# Patient Record
Sex: Male | Born: 1990 | Race: White | Hispanic: No | Marital: Single | Smoking: Never smoker
Health system: Southern US, Community
[De-identification: ages and names within clinical notes are randomized; demographics above are authoritative.]

## PROBLEM LIST (undated history)

## (undated) DIAGNOSIS — I10 Essential (primary) hypertension: Secondary | ICD-10-CM

---
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
## 2018-12-01 ENCOUNTER — Other Ambulatory Visit: Payer: Self-pay

## 2018-12-01 DIAGNOSIS — Z20822 Contact with and (suspected) exposure to covid-19: Secondary | ICD-10-CM

## 2018-12-02 LAB — NOVEL CORONAVIRUS, NAA: SARS-CoV-2, NAA: DETECTED — AB

## 2019-01-30 ENCOUNTER — Other Ambulatory Visit: Payer: Self-pay

## 2019-01-30 ENCOUNTER — Emergency Department: Payer: Managed Care, Other (non HMO)

## 2019-01-30 ENCOUNTER — Emergency Department
Admission: EM | Admit: 2019-01-30 | Discharge: 2019-01-30 | Disposition: A | Discharge status: Home / Self Care | Payer: Managed Care, Other (non HMO) | Attending: Emergency Medicine | Admitting: Emergency Medicine

## 2019-01-30 ENCOUNTER — Encounter: Payer: Self-pay | Admitting: Emergency Medicine

## 2019-01-30 DIAGNOSIS — I10 Essential (primary) hypertension: Secondary | ICD-10-CM | POA: Diagnosis not present

## 2019-01-30 DIAGNOSIS — R079 Chest pain, unspecified: Secondary | ICD-10-CM

## 2019-01-30 DIAGNOSIS — R55 Syncope and collapse: Secondary | ICD-10-CM | POA: Diagnosis not present

## 2019-01-30 DIAGNOSIS — R0781 Pleurodynia: Secondary | ICD-10-CM | POA: Insufficient documentation

## 2019-01-30 DIAGNOSIS — R091 Pleurisy: Secondary | ICD-10-CM

## 2019-01-30 HISTORY — DX: Essential (primary) hypertension: I10

## 2019-01-30 LAB — BASIC METABOLIC PANEL
Anion gap: 13 (ref 5–15)
BUN: 14 mg/dL (ref 6–20)
CO2: 25 mmol/L (ref 22–32)
Calcium: 9.9 mg/dL (ref 8.9–10.3)
Chloride: 102 mmol/L (ref 98–111)
Creatinine, Ser: 1.3 mg/dL — ABNORMAL HIGH (ref 0.61–1.24)
GFR calc Af Amer: >60 mL/min (ref >60)
GFR calc non Af Amer: >60 mL/min (ref >60)
Glucose, Bld: 96 mg/dL (ref 70–99)
Potassium: 3.2 mmol/L — ABNORMAL LOW (ref 3.5–5.1)
Sodium: 140 mmol/L (ref 135–145)

## 2019-01-30 LAB — CBC
HCT: 45.1 % (ref 39.0–52.0)
Hemoglobin: 15.6 g/dL (ref 13.0–17.0)
MCH: 29.6 pg (ref 26.0–34.0)
MCHC: 34.6 g/dL (ref 30.0–36.0)
MCV: 85.6 fL (ref 80.0–100.0)
Platelets: 360 10*3/uL (ref 150–400)
RBC: 5.27 MIL/uL (ref 4.22–5.81)
RDW: 11.8 % (ref 11.5–15.5)
WBC: 7.7 10*3/uL (ref 4.0–10.5)
nRBC: 0 % (ref 0.0–0.2)

## 2019-01-30 LAB — TROPONIN I (HIGH SENSITIVITY): Troponin I (High Sensitivity): 2 ng/L (ref <18)

## 2019-01-30 MED ORDER — ONDANSETRON 4 MG PO TBDP: 4.0000 mg | ORAL_TABLET | Freq: Three times a day (TID) | ORAL | 0 refills | Status: AC | PRN

## 2019-01-30 MED ORDER — IOHEXOL 350 MG/ML SOLN
75.0000 mL | Freq: Once | INTRAVENOUS | Status: AC | PRN
  Administered 2019-01-30: 22:00:00 75 mL via INTRAVENOUS

## 2019-01-30 MED ORDER — SODIUM CHLORIDE 0.9 % IV BOLUS
1000.0000 mL | Freq: Once | INTRAVENOUS | Status: AC
  Administered 2019-01-30: 1000 mL via INTRAVENOUS

## 2019-01-30 MED ORDER — KETOROLAC TROMETHAMINE 10 MG PO TABS: 10.0000 mg | ORAL_TABLET | Freq: Four times a day (QID) | ORAL | 0 refills | Status: AC | PRN

## 2019-01-30 NOTE — Discharge Instructions (Signed)
Your lab tests and CT scan of the chest were all normal today.  Discontinue hydrochlorothiazide as this medication is making you dehydrated and have a low potassium level.  Continue follow-up with your doctor to monitor blood pressure and your symptoms.

## 2019-01-30 NOTE — ED Notes (Addendum)
Pt had syncopal episode in triage, became diaphoretic and pale. IV access obtain and IVF bolus started. Pt reports recently starting on HCTZ. Pt taken via recliner to room 18. Primary RN Darl Pikes notified. Pt is alert at this time, disoriented after passing out, but is able to state his name, the date, where he is and why he is here.

## 2019-01-30 NOTE — ED Provider Notes (Signed)
[facility] Emergency Department Provider Note  ____________________________________________  Time seen: Approximately 8:59 PM  I have reviewed the triage vital signs and the nursing notes.   HISTORY  Chief Complaint Chest Pain    HPI Calvin Acosta is a 29 y.o. male with a reported past medical history of hypertension, started on hydrochlorothiazide 4 days ago.  He also had Covid 2 months ago which was a mild illness that resolved quickly.  He reports that for the last 3 days he is having pleuritic chest pain, shortness of breath, dizziness with standing.  No fevers chills or malaise.  No body aches.  Symptoms are constant, no alleviating factors.  Nonradiating.  In triage while having his blood drawn, he suddenly became pale diaphoretic and nearly passed out, blood pressure was briefly low at 67/39.  He now feels better after that episode which only lasted a few minutes..      Past Medical History:  Diagnosis Date  . Hypertension      There are no problems to display for this patient.    History reviewed. No pertinent surgical history.   Prior to Admission medications   Medication Sig Start Date End Date Taking? Authorizing Provider  ketorolac (TORADOL) 10 MG tablet Take 1 tablet (10 mg total) by mouth every 6 (six) hours as needed for moderate pain. 01/30/19   Sharman Cheek, MD  ondansetron (ZOFRAN ODT) 4 MG disintegrating tablet Take 1 tablet (4 mg total) by mouth every 8 (eight) hours as needed for nausea or vomiting. 01/30/19   Sharman Cheek, MD  Hydrochlorothiazide  Allergies Patient has no known allergies.   No family history on file.  Social History Social History   Tobacco Use  . Smoking status: Never Smoker  . Smokeless tobacco: Never Used  Substance Use Topics  . Alcohol use: Not on file  . Drug use: Not on file    Review of Systems  Constitutional:   No fever or chills.  ENT:   No sore throat. No  rhinorrhea. Cardiovascular:   Positive chest pain as above and triage episode of syncope. Respiratory: Positive shortness of breath without cough. Gastrointestinal:   Negative for abdominal pain, vomiting and diarrhea.  Musculoskeletal:   Negative for focal pain or swelling All other systems reviewed and are negative except as documented above in ROS and HPI.  ____________________________________________   PHYSICAL EXAM:  VITAL SIGNS: ED Triage Vitals  Enc Vitals Group     BP 01/30/19 1935 120/79     Pulse Rate 01/30/19 1935 (!) 106     Resp 01/30/19 1935 18     Temp --      Temp src --      SpO2 01/30/19 1935 98 %     Weight 01/30/19 1955 205 lb (93 kg)     Height 01/30/19 1955 6\' 2"  (1.88 m)     Head Circumference --      Peak Flow --      Pain Score 01/30/19 1955 4     Pain Loc --      Pain Edu? --      Excl. in GC? --     Vital signs reviewed, nursing assessments reviewed.   Constitutional:   Alert and oriented. Non-toxic appearance. Eyes:   Conjunctivae are normal. EOMI. PERRL. ENT      Head:   Normocephalic and atraumatic.      Nose:   Wearing a mask.      Mouth/Throat:  Wearing a mask.      Neck:   No meningismus. Full ROM. Hematological/Lymphatic/Immunilogical:   No cervical lymphadenopathy. Cardiovascular:   RRR. Symmetric bilateral radial and DP pulses.  No murmurs. Cap refill less than 2 seconds. Respiratory:   Normal respiratory effort without tachypnea/retractions. Breath sounds are clear and equal bilaterally. No wheezes/rales/rhonchi. Gastrointestinal:   Soft and nontender. Non distended. There is no CVA tenderness.  No rebound, rigidity, or guarding. Genitourinary:   deferred Musculoskeletal:   Normal range of motion in all extremities. No joint effusions.  No lower extremity tenderness.  No edema. Neurologic:   Normal speech and language.  Motor grossly intact. No acute focal neurologic deficits are appreciated.  Skin:    Skin is warm, dry and  intact. No rash noted.  No petechiae, purpura, or bullae.  ____________________________________________    LABS (pertinent positives/negatives) (all labs ordered are listed, but only abnormal results are displayed) Labs Reviewed  BASIC METABOLIC PANEL - Abnormal; Notable for the following components:      Result Value   Potassium 3.2 (*)    Creatinine, Ser 1.30 (*)    All other components within normal limits  CBC  D-DIMER, QUANTITATIVE (NOT AT [facility])  URINALYSIS, COMPLETE (UACMP) WITH MICROSCOPIC  TROPONIN I (HIGH SENSITIVITY)   ____________________________________________   EKG  Interpreted by me Sinus tachycardia rate 104, right axis, normal intervals.  Normal QRS ST segments and T waves.  Repeat EKG performed at 8:05 PM, shows right axis, sinus rhythm rate of 74, no acute interval changes.  ____________________________________________    RADIOLOGY  CT Angio Chest PE W and/or Wo Contrast  Result Date: 01/30/2019 CLINICAL DATA:  PE suspected, chest pain, recent COVID-19 infection EXAM: CT ANGIOGRAPHY CHEST WITH CONTRAST TECHNIQUE: Multidetector CT imaging of the chest was performed using the standard protocol during bolus administration of intravenous contrast. Multiplanar CT image reconstructions and MIPs were obtained to evaluate the vascular anatomy. CONTRAST:  38mL OMNIPAQUE IOHEXOL 350 MG/ML SOLN COMPARISON:  None. FINDINGS: Cardiovascular: Satisfactory opacification of the pulmonary arteries to the segmental level. No evidence of pulmonary embolism. Normal heart size. No pericardial effusion. Mediastinum/Nodes: No enlarged mediastinal, hilar, or axillary lymph nodes. Thyroid gland, trachea, and esophagus demonstrate no significant findings. Lungs/Pleura: Lungs are clear. No pleural effusion or pneumothorax. Upper Abdomen: No acute abnormality. Musculoskeletal: No chest wall abnormality. No acute or significant osseous findings. Review of the MIP images confirms the above  findings. IMPRESSION: 1.  Negative examination for pulmonary embolism. 2.  No acute airspace opacity. Electronically Signed   By: Lauralyn Primes M.D.   On: 01/30/2019 21:55   DG Chest Portable 1 View  Result Date: 01/30/2019 CLINICAL DATA:  Chest pain, recent COVID-19 EXAM: PORTABLE CHEST 1 VIEW COMPARISON:  02/02/2010 FINDINGS: The heart size and mediastinal contours are within normal limits. Both lungs are clear. The visualized skeletal structures are unremarkable. IMPRESSION: No acute abnormality of the lungs in AP portable projection. Electronically Signed   By: Lauralyn Primes M.D.   On: 01/30/2019 20:30    ____________________________________________   PROCEDURES Procedures  ____________________________________________  DIFFERENTIAL DIAGNOSIS   Dehydration due to diuretic use, electrolyte abnormality due to diuretic use, pulmonary embolism, pneumonia, pneumothorax, pleural effusion, post Covid syndrome, carditis  CLINICAL IMPRESSION / ASSESSMENT AND PLAN / ED COURSE  Medications ordered in the ED: Medications  sodium chloride 0.9 % bolus 1,000 mL (0 mLs Intravenous Stopped 01/30/19 2135)  iohexol (OMNIPAQUE) 350 MG/ML injection 75 mL (75 mLs Intravenous Contrast Given 01/30/19 2143)  Pertinent labs & imaging results that were available during my care of the patient were reviewed by me and considered in my medical decision making (see chart for details).  YEHUDA PRINTUP was evaluated in Emergency Department on 01/30/2019 for the symptoms described in the history of present illness. He was evaluated in the context of the global COVID-19 pandemic, which necessitated consideration that the patient might be at risk for infection with the SARS-CoV-2 virus that causes COVID-19. Institutional protocols and algorithms that pertain to the evaluation of patients at risk for COVID-19 are in a state of rapid change based on information released by regulatory bodies including the CDC and federal and state  organizations. These policies and algorithms were followed during the patient's care in the ED.   Patient presents with 3 days of pleuritic chest pain or shortness of breath.  The nature of his syncope episode in triage is highly consistent with a vagal episode.  Vital signs quickly returned to normal after that.  EKG and chest x-ray are unremarkable.  Given his recent Covid diagnosis and pleuritic chest pain without any clear explanation, pulmonary embolism must be ruled out so have ordered a CT scan of the chest.  Initial labs are unremarkable including a troponin of 2.  Given his 3 days of symptoms, this does not need to be repeated and is helpful in ruling out myocarditis.  If CT scan is unremarkable, would plan to discontinue his hydrochlorothiazide, withhold any antihypertensives, have him follow-up with his doctor this week.  Clinical Course as of Jan 29 2210  Sat Jan 30, 2019  2210 CT scan negative, clear lungs.  Vital signs remain normal.  Stable for discharge home   [PS]    Clinical Course User Index [PS] Carrie Mew, MD     ____________________________________________   FINAL CLINICAL IMPRESSION(S) / ED DIAGNOSES    Final diagnoses:  Nonspecific chest pain  Pleurisy     ED Discharge Orders         Ordered    ketorolac (TORADOL) 10 MG tablet  Every 6 hours PRN     01/30/19 2211    ondansetron (ZOFRAN ODT) 4 MG disintegrating tablet  Every 8 hours PRN     01/30/19 2211          Portions of this note were generated with dragon dictation software. Dictation errors may occur despite best attempts at proofreading.   Carrie Mew, MD 01/30/19 2212

## 2019-01-30 NOTE — ED Triage Notes (Signed)
Pt arrived via POV with reports of chest pain x 4 days and shortness of breath. Pt was COVID positive 12/01/18.  Pt reports some fatigue with the chest pain and lightheadedness while sitting.   Pt became lightheaded in triage had near syncopal episode. Became diaphoretic and pale.

## 2019-01-31 LAB — D-DIMER, QUANTITATIVE: D-Dimer, Quant: 0.29 ug/mL-FEU (ref 0.00–0.50)

## 2021-05-09 DIAGNOSIS — M25552 Pain in left hip: Secondary | ICD-10-CM | POA: Diagnosis not present

## 2021-05-24 DIAGNOSIS — Z0000 Encounter for general adult medical examination without abnormal findings: Secondary | ICD-10-CM | POA: Diagnosis not present

## 2021-05-24 DIAGNOSIS — Z131 Encounter for screening for diabetes mellitus: Secondary | ICD-10-CM | POA: Diagnosis not present

## 2021-05-24 DIAGNOSIS — I10 Essential (primary) hypertension: Secondary | ICD-10-CM | POA: Diagnosis not present

## 2021-05-24 DIAGNOSIS — E782 Mixed hyperlipidemia: Secondary | ICD-10-CM | POA: Diagnosis not present
